# Patient Record
Sex: Male | Born: 1990 | Race: Black or African American | Hispanic: No | Marital: Single | Smoking: Former smoker
Health system: Southern US, Community
[De-identification: ages and names within clinical notes are randomized; demographics above are authoritative.]

## PROBLEM LIST (undated history)

## (undated) DIAGNOSIS — R7612 Nonspecific reaction to cell mediated immunity measurement of gamma interferon antigen response without active tuberculosis: Secondary | ICD-10-CM

## (undated) DIAGNOSIS — R911 Solitary pulmonary nodule: Secondary | ICD-10-CM

## (undated) HISTORY — DX: Nonspecific reaction to cell mediated immunity measurement of gamma interferon antigen response without active tuberculosis: R76.12

## (undated) HISTORY — DX: Solitary pulmonary nodule: R91.1

---
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
## 2011-09-21 ENCOUNTER — Emergency Department (HOSPITAL_COMMUNITY): Payer: Self-pay

## 2011-09-21 ENCOUNTER — Emergency Department (HOSPITAL_COMMUNITY)
Admission: EM | Admit: 2011-09-21 | Discharge: 2011-09-21 | Disposition: A | Discharge status: Home / Self Care | Payer: Self-pay | Attending: Emergency Medicine | Admitting: Emergency Medicine

## 2011-09-21 ENCOUNTER — Encounter (HOSPITAL_COMMUNITY): Payer: Self-pay | Admitting: *Deleted

## 2011-09-21 DIAGNOSIS — W268XXA Contact with other sharp object(s), not elsewhere classified, initial encounter: Secondary | ICD-10-CM | POA: Insufficient documentation

## 2011-09-21 DIAGNOSIS — F17200 Nicotine dependence, unspecified, uncomplicated: Secondary | ICD-10-CM | POA: Insufficient documentation

## 2011-09-21 DIAGNOSIS — S51809A Unspecified open wound of unspecified forearm, initial encounter: Secondary | ICD-10-CM | POA: Insufficient documentation

## 2011-09-21 DIAGNOSIS — S41109A Unspecified open wound of unspecified upper arm, initial encounter: Secondary | ICD-10-CM | POA: Insufficient documentation

## 2011-09-21 DIAGNOSIS — Y92009 Unspecified place in unspecified non-institutional (private) residence as the place of occurrence of the external cause: Secondary | ICD-10-CM | POA: Insufficient documentation

## 2011-09-21 DIAGNOSIS — S51811A Laceration without foreign body of right forearm, initial encounter: Secondary | ICD-10-CM

## 2011-09-21 MED ORDER — DOUBLE ANTIBIOTIC 500-10000 UNIT/GM EX OINT
TOPICAL_OINTMENT | Freq: Once | CUTANEOUS | Status: AC
  Administered 2011-09-21: 17:00:00 via TOPICAL

## 2011-09-21 MED ORDER — BACITRACIN ZINC 500 UNIT/GM EX OINT
TOPICAL_OINTMENT | CUTANEOUS | Status: AC
  Filled 2011-09-21: qty 2.7

## 2011-09-21 MED ORDER — LIDOCAINE HCL (PF) 1 % IJ SOLN
INTRAMUSCULAR | Status: AC
  Filled 2011-09-21: qty 5

## 2011-09-21 MED ORDER — HYDROCODONE-ACETAMINOPHEN 5-325 MG PO TABS: 1.0000 | ORAL_TABLET | ORAL | Status: AC | PRN

## 2011-09-21 MED ORDER — LIDOCAINE HCL (PF) 1 % IJ SOLN
10.0000 mL | Freq: Once | INTRAMUSCULAR | Status: AC
  Administered 2011-09-21: 10 mL
  Filled 2011-09-21: qty 5

## 2011-09-21 MED ORDER — IBUPROFEN 800 MG PO TABS
800.0000 mg | ORAL_TABLET | Freq: Once | ORAL | Status: AC
  Administered 2011-09-21: 800 mg via ORAL
  Filled 2011-09-21: qty 1

## 2011-09-21 NOTE — ED Provider Notes (Signed)
History     CSN: [id number]  Arrival date & time 09/21/11  1304   First MD Initiated Contact with Patient 09/21/11 1345      Chief Complaint  Patient presents with  . Extremity Laceration    (Consider location/radiation/quality/duration/timing/severity/associated sxs/prior treatment) HPI Comments: Kevin Cooley presents with laceration to his right forearm and upper arm after he broke the glass on his storm door attempting to get into his locked house.  He has applied direct pressure to the wounds and the bleeding has improved.  Pain is constant at the sites.  He denies numbness or weakness distal to the injury sites.  He is up to date with his tetanus status.  The history is provided by the patient and a relative.    History reviewed. No pertinent past medical history.  History reviewed. No pertinent past surgical history.  History reviewed. No pertinent family history.  History  Substance Use Topics  . Smoking status: Current Everyday Smoker  . Smokeless tobacco: Not on file  . Alcohol Use: No      Review of Systems  Constitutional: Negative for fever and chills.  HENT: Negative for facial swelling.   Respiratory: Negative for shortness of breath and wheezing.   Skin: Positive for wound.  Neurological: Negative for weakness and numbness.    Allergies  Review of patient's allergies indicates no known allergies.  Home Medications   Current Outpatient Rx  Name Route Sig Dispense Refill  . HYDROCODONE-ACETAMINOPHEN 5-325 MG PO TABS Oral Take 1 tablet by mouth every 4 (four) hours as needed for pain. 15 tablet 0    BP 135/98  Pulse 77  Temp 97.5 F (36.4 C) (Oral)  Resp 20  Ht 5\' 10"  (1.778 m)  Wt 141 lb (63.957 kg)  BMI 20.23 kg/m2  SpO2 100%  Physical Exam  Constitutional: He is oriented to person, place, and time. He appears well-developed and well-nourished.  HENT:  Head: Normocephalic.  Cardiovascular: Normal rate.   Pulses:      Radial  pulses are 2+ on the right side, and 2+ on the left side.  Pulmonary/Chest: Effort normal.  Musculoskeletal: He exhibits tenderness.       Left upper arm: He exhibits laceration.       Arms:      ttp at sites of lacerations.  Neurological: He is alert and oriented to person, place, and time. He has normal strength. No sensory deficit.  Skin: Laceration noted.          Superficial posterior upper arm abrasion.    ED Course  Procedures (including critical care time)  Labs Reviewed - No data to display Dg Forearm Right  09/21/2011  *RADIOLOGY REPORT*  Clinical Data: Laceration.  RIGHT FOREARM - 2 VIEW  Comparison: 09/21/2011 upper arm.  Findings: Soft tissue defect noted within the medial soft tissues of the right forearm.  No radiopaque foreign body.  No underlying bony abnormality.  No fracture, subluxation or dislocation.  IMPRESSION: Soft tissue defect.  No fracture or radiopaque foreign body.   Original Report Authenticated By: Cyndie Chime, M.D.    Dg Humerus Right  09/21/2011  *RADIOLOGY REPORT*  Clinical Data: Extremity laceration.  RIGHT HUMERUS - 2+ VIEW  Comparison: None.  Findings: No acute bony abnormality.  Specifically, no fracture, subluxation, or dislocation.  Soft tissues are intact.  No radiopaque foreign bodies within the soft tissues.  IMPRESSION: No acute bony abnormality.   Original Report Authenticated By: Cyndie Chime,  M.D.      1. Laceration of right forearm       MDM  Multiple lacerations with wound repair.  Xrays reviewed and negative for glass fb.  Wounds were clean. Dressing applied with bacitracin ointment.  Return in 10 days for suture removal.    LACERATION REPAIR Performed by: Burgess Amor Authorized by: Burgess Amor Consent: Verbal consent obtained. Risks and benefits: risks, benefits and alternatives were discussed Consent given by: patient Patient identity confirmed: provided demographic data Prepped and Draped in normal sterile  fashion Wound explored  Laceration Location: right upper arm  Laceration Length: 3cm  No Foreign Bodies seen or palpated  Anesthesia: local infiltration  Local anesthetic: lidocaine 1% without epinephrine  Anesthetic total: 2 ml  Irrigation method: syringe Amount of cleaning: standard  Skin closure: ethilon 4-0  Number of sutures: 7  Technique: simple interrupted  Patient tolerance: Patient tolerated the procedure well with no immediate complications.   LACERATION REPAIR Performed by: Burgess Amor Authorized by: Burgess Amor Consent: Verbal consent obtained.    Risks and benefits: risks, benefits and alternatives were discussed Consent given by: patient Patient identity confirmed: provided demographic data Prepped and Draped in normal sterile fashion Wound explored  Laceration Location: right volar forearm  Laceration Length: 6cm  No Foreign Bodies seen or palpated  Anesthesia: local infiltration  Local anesthetic: lidocaine 1% without epinephrine  Anesthetic total: 4 ml  Irrigation method: syringe Amount of cleaning: standard  Skin closure: ethilon 4-0  Number of sutures: 10  Technique: simple interrupted  Patient tolerance: Patient tolerated the procedure well with no immediate complications.   LACERATION REPAIR Performed by: Burgess Amor Authorized by: Burgess Amor Consent: Verbal consent obtained. Risks and benefits: risks, benefits and alternatives were discussed Consent given by: patient Patient identity confirmed: provided demographic data Prepped and Draped in normal sterile fashion Wound explored  Laceration Location: 1  Laceration Length:  0.75 cm  No Foreign Bodies seen or palpated  Anesthesia: local infiltration  Local anesthetic: lidocaine 1% without epinephrine  Anesthetic total: 1 ml  Irrigation method: syringe Amount of cleaning: standard  Skin closure: ethilon 4-0  Number of sutures: 3  Technique: simple  interrupted.  Patient tolerance: Patient tolerated the procedure well with no immediate complications.      Burgess Amor, [place] 09/21/11 1734

## 2011-09-21 NOTE — ED Notes (Addendum)
Lac to rt forearm, put arm thru door window. Lac also to upper arm,No active bleeding.

## 2011-09-24 NOTE — ED Provider Notes (Signed)
Medical screening examination/treatment/procedure(s) were performed by non-physician practitioner and as supervising physician I was immediately available for consultation/collaboration. Gonsalo Cuthbertson, MD, FACEP   Abbagail Scaff L Jacorey Donaway, MD 09/24/11 0238 

## 2011-09-30 ENCOUNTER — Emergency Department (HOSPITAL_COMMUNITY)
Admission: EM | Admit: 2011-09-30 | Discharge: 2011-09-30 | Disposition: A | Discharge status: Home / Self Care | Payer: Self-pay | Attending: Emergency Medicine | Admitting: Emergency Medicine

## 2011-09-30 ENCOUNTER — Encounter (HOSPITAL_COMMUNITY): Payer: Self-pay

## 2011-09-30 DIAGNOSIS — IMO0002 Reserved for concepts with insufficient information to code with codable children: Secondary | ICD-10-CM

## 2011-09-30 DIAGNOSIS — Z4802 Encounter for removal of sutures: Secondary | ICD-10-CM | POA: Insufficient documentation

## 2011-09-30 DIAGNOSIS — F17200 Nicotine dependence, unspecified, uncomplicated: Secondary | ICD-10-CM | POA: Insufficient documentation

## 2011-09-30 NOTE — ED Notes (Signed)
Here for suture, staple removal.  Dressed.NAD

## 2011-09-30 NOTE — ED Notes (Signed)
Pt reports here for staple removal from r arm.  Says were put in last Monday.

## 2011-10-01 NOTE — ED Provider Notes (Signed)
Medical screening examination/treatment/procedure(s) were performed by non-physician practitioner and as supervising physician I was immediately available for consultation/collaboration.  Winton Offord, MD 10/01/11 2248 

## 2011-10-01 NOTE — ED Provider Notes (Signed)
History     CSN: [id number]  Arrival date & time 09/30/11  1550   First MD Initiated Contact with Patient 09/30/11 1607      Chief Complaint  Patient presents with  . Suture / Staple Removal    (Consider location/radiation/quality/duration/timing/severity/associated sxs/prior treatment) Patient is a 21 y.o. male presenting with suture removal. The history is provided by the patient.  Suture / Staple Removal  The sutures were placed 7 to 10 days ago. There has been no treatment since the wound repair. There has been no drainage from the wound. There is no redness present. The swelling has improved. The pain has improved.    History reviewed. No pertinent past medical history.  History reviewed. No pertinent past surgical history.  No family history on file.  History  Substance Use Topics  . Smoking status: Current Everyday Smoker  . Smokeless tobacco: Not on file  . Alcohol Use: No      Review of Systems  Constitutional: Negative for fever and chills.  HENT: Negative for facial swelling.   Respiratory: Negative for shortness of breath and wheezing.   Skin: Positive for wound.  Neurological: Negative for numbness.    Allergies  Review of patient's allergies indicates no known allergies.  Home Medications   Current Outpatient Rx  Name Route Sig Dispense Refill  . HYDROCODONE-ACETAMINOPHEN 5-325 MG PO TABS Oral Take 1 tablet by mouth every 4 (four) hours as needed for pain. 15 tablet 0    BP 134/82  Pulse 88  Temp 99 F (37.2 C) (Oral)  Resp 20  Ht 5\' 10"  (1.778 m)  Wt 134 lb (60.782 kg)  BMI 19.23 kg/m2  SpO2 100%  Physical Exam  Constitutional: He is oriented to person, place, and time. He appears well-developed and well-nourished.  HENT:  Head: Normocephalic.  Cardiovascular: Normal rate.   Pulmonary/Chest: Effort normal.  Neurological: He is alert and oriented to person, place, and time. No sensory deficit.  Skin: Laceration noted.       3  sutured sites on right volar forearm and upper arm,  All appear healing well with no erythema or evidence of infection.  The larger wound on the distal upper arm has edema at one end of the wound site, none tender, no drainage or erythema.  The laceration at this not completely approximated.      ED Course  Procedures (including critical care time)  Labs Reviewed - No data to display No results found.   1. Dressing change/suture removal     Sutures removed without difficulty.  Largest lac with continued edema continues to have slightly separating wound edges.  Sterile strips were applied for added strength.  Pt encouraged to continue protecting this site with dressing,  Heating pad may help reduce swelling,  Suspect hematoma, as there is no ttp,  No erythema or increases warmth.  MDM  Prn f/u.  Pt advised to allow sterile strips to fall away.  Return here for a recheck if pt has any problems or concerns.        Burgess Amor, [place] 10/01/11 [phone]

## 2011-12-21 ENCOUNTER — Emergency Department (HOSPITAL_COMMUNITY)
Admission: EM | Admit: 2011-12-21 | Discharge: 2011-12-21 | Disposition: A | Discharge status: Home / Self Care | Payer: Self-pay | Attending: Emergency Medicine | Admitting: Emergency Medicine

## 2011-12-21 ENCOUNTER — Encounter (HOSPITAL_COMMUNITY): Payer: Self-pay | Admitting: *Deleted

## 2011-12-21 ENCOUNTER — Emergency Department (HOSPITAL_COMMUNITY): Payer: Self-pay

## 2011-12-21 DIAGNOSIS — R1013 Epigastric pain: Secondary | ICD-10-CM | POA: Insufficient documentation

## 2011-12-21 DIAGNOSIS — F17200 Nicotine dependence, unspecified, uncomplicated: Secondary | ICD-10-CM | POA: Insufficient documentation

## 2011-12-21 DIAGNOSIS — R109 Unspecified abdominal pain: Secondary | ICD-10-CM

## 2011-12-21 MED ORDER — GI COCKTAIL ~~LOC~~
30.0000 mL | Freq: Once | ORAL | Status: AC
  Administered 2011-12-21: 30 mL via ORAL
  Filled 2011-12-21: qty 30

## 2011-12-21 MED ORDER — RANITIDINE HCL 150 MG PO CAPS: 150.0000 mg | ORAL_CAPSULE | Freq: Two times a day (BID) | ORAL | Status: DC

## 2011-12-21 NOTE — ED Provider Notes (Signed)
History    This chart was scribed for [name], MD, MD by Smitty Pluck, ED Scribe. The patient was seen in room [facility]11 and the patient's care was started at 5:21PM.   CSN: [id number]  Arrival date & time 12/21/11  1646      Chief Complaint  Patient presents with  . Abdominal Pain    (Consider location/radiation/quality/duration/timing/severity/associated sxs/prior treatment) Patient is a 21 y.o. male presenting with abdominal pain. The history is provided by the patient. No language interpreter was used.  Abdominal Pain The primary symptoms of the illness include abdominal pain. The primary symptoms of the illness do not include fatigue or diarrhea. The current episode started 2 days ago. The onset of the illness was sudden. The problem has not changed since onset. The abdominal pain is located in the epigastric region. The abdominal pain does not radiate. The abdominal pain is relieved by nothing.  The patient has had a change in bowel habit. Symptoms associated with the illness do not include hematuria, frequency or back pain.   Kevin Cooley is a 21 y.o. male who presents to the Emergency Department complaining of constant, moderate abdominal pain onset 2 days ago. Pt reports that he is constipated and had last bowel movement 2 days ago. He denies nausea, vomiting and diarrhea.   History reviewed. No pertinent past medical history.  History reviewed. No pertinent past surgical history.  History reviewed. No pertinent family history.  History  Substance Use Topics  . Smoking status: Current Every Day Smoker    Types: Cigarettes  . Smokeless tobacco: Not on file  . Alcohol Use: No      Review of Systems  Constitutional: Negative for fatigue.  HENT: Negative for congestion, sinus pressure and ear discharge.   Eyes: Negative for discharge.  Respiratory: Negative for cough.   Cardiovascular: Negative for chest pain.  Gastrointestinal: Positive for abdominal  pain. Negative for diarrhea.  Genitourinary: Negative for frequency and hematuria.  Musculoskeletal: Negative for back pain.  Skin: Negative for rash.  Neurological: Negative for seizures and headaches.  Hematological: Negative.   Psychiatric/Behavioral: Negative for hallucinations.  All other systems reviewed and are negative.    Allergies  Review of patient's allergies indicates no known allergies.  Home Medications  No current outpatient prescriptions on file.  BP 136/79  Pulse 76  Temp 97.5 F (36.4 C) (Oral)  Resp 20  Ht 5\' 10"  (1.778 m)  Wt 30 lb (13.608 kg)  BMI 4.30 kg/m2  SpO2 99%  Physical Exam  Nursing note and vitals reviewed. Constitutional: He is oriented to person, place, and time. He appears well-developed.  HENT:  Head: Normocephalic and atraumatic.  Eyes: Conjunctivae normal and EOM are normal. No scleral icterus.  Neck: Neck supple. No thyromegaly present.  Cardiovascular: Normal rate and regular rhythm.  Exam reveals no gallop and no friction rub.   No murmur heard. Pulmonary/Chest: No stridor. He has no wheezes. He has no rales. He exhibits no tenderness.  Abdominal: He exhibits no distension. There is tenderness in the epigastric area. There is no rebound.  Musculoskeletal: Normal range of motion. He exhibits no edema.  Lymphadenopathy:    He has no cervical adenopathy.  Neurological: He is oriented to person, place, and time. Coordination normal.  Skin: No rash noted. No erythema.  Psychiatric: He has a normal mood and affect. His behavior is normal.    ED Course  Procedures (including critical care time) DIAGNOSTIC STUDIES: Oxygen Saturation is 99%  on room air, normal by my interpretation.    COORDINATION OF CARE: 5:24 PM Discussed ED treatment with pt  5:30 PM Ordered:    . [COMPLETED] gi cocktail  30 mL Oral Once   6:55PM Recheck. Pt is feeling better after medication. Pt is ready for discharge.     Labs Reviewed - No data to  display No results found.   No diagnosis found.    MDM     The chart was scribed for me under my direct supervision.  I personally performed the history, physical, and medical decision making and all procedures in the evaluation of this patient.[name], MD 12/21/11 1900

## 2011-12-21 NOTE — ED Notes (Signed)
Pt with epigastric abd pain x 2 days, denies any N/V/D

## 2011-12-21 NOTE — ED Notes (Signed)
Pt alert & oriented x4, stable gait. Patient given discharge instructions, paperwork & prescription(s). Patient  instructed to stop at the registration desk to finish any additional paperwork. Patient verbalized understanding. Pt left department w/ no further questions. 

## 2014-08-15 IMAGING — CR DG ABDOMEN ACUTE W/ 1V CHEST
4 series · 4 of 4 positions shown · non-contrast
Comparison: 04/09/2008

CLINICAL DATA: Epigastric pain

ACUTE ABDOMEN SERIES (ABDOMEN 2 VIEW & CHEST 1 VIEW)

[view not recorded (1 of 4)]
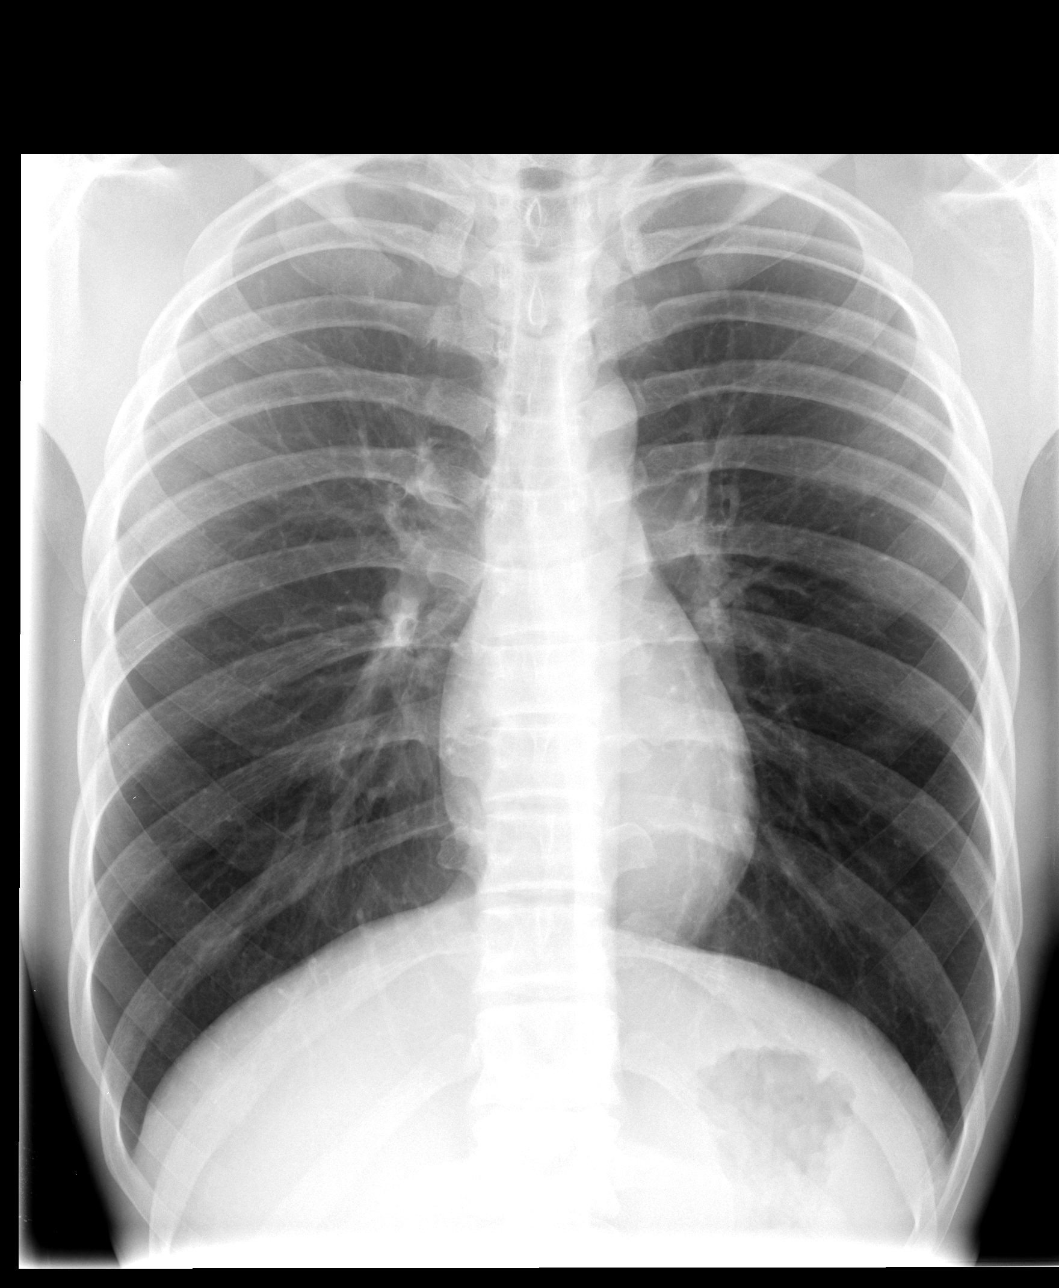

[view not recorded (2 of 4)]
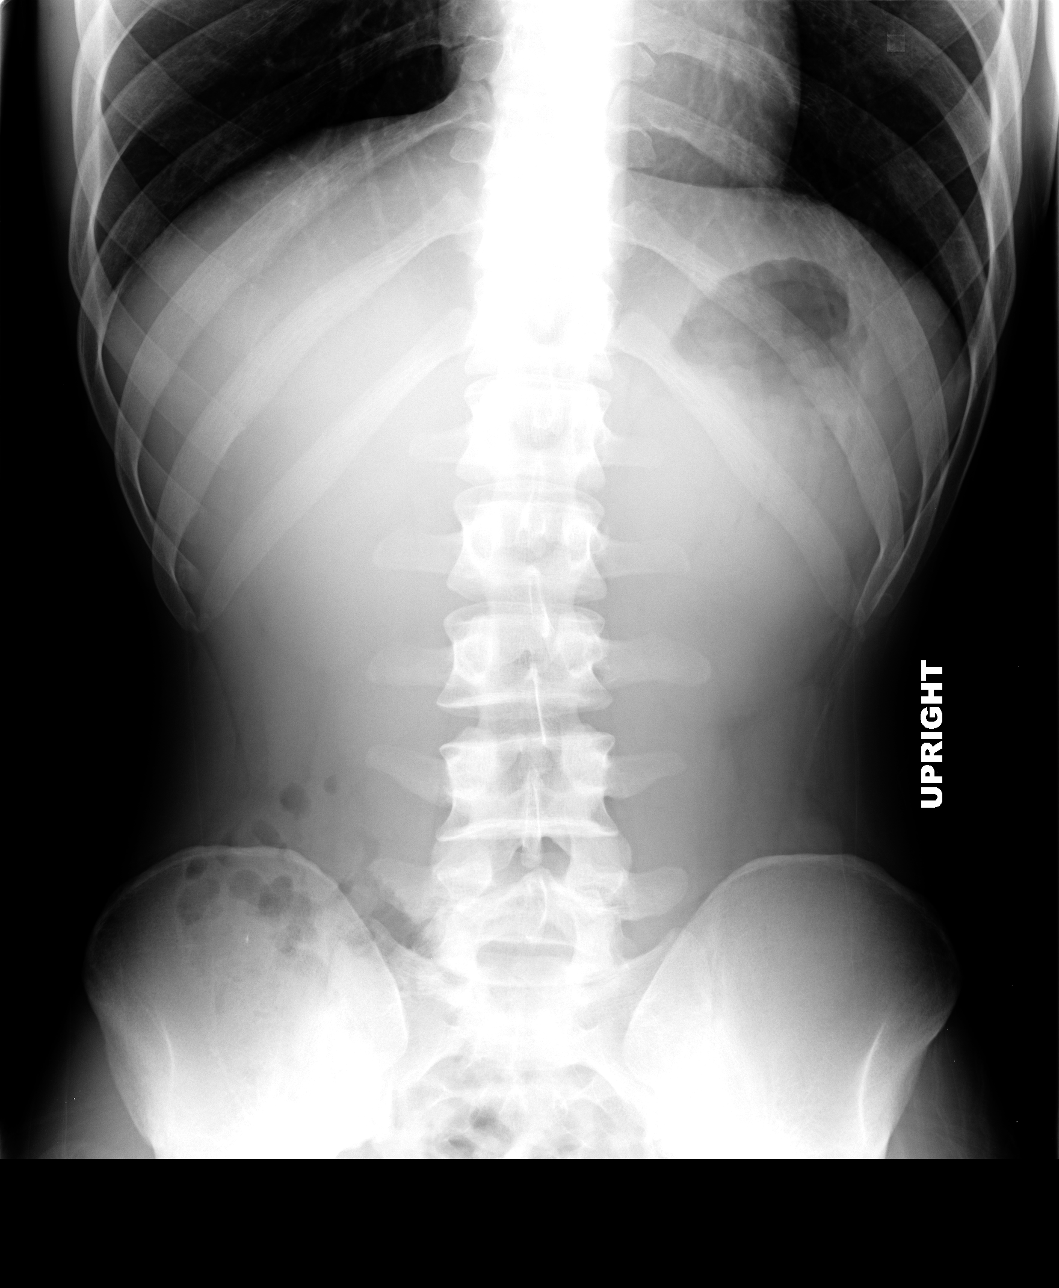

[view not recorded (3 of 4)]
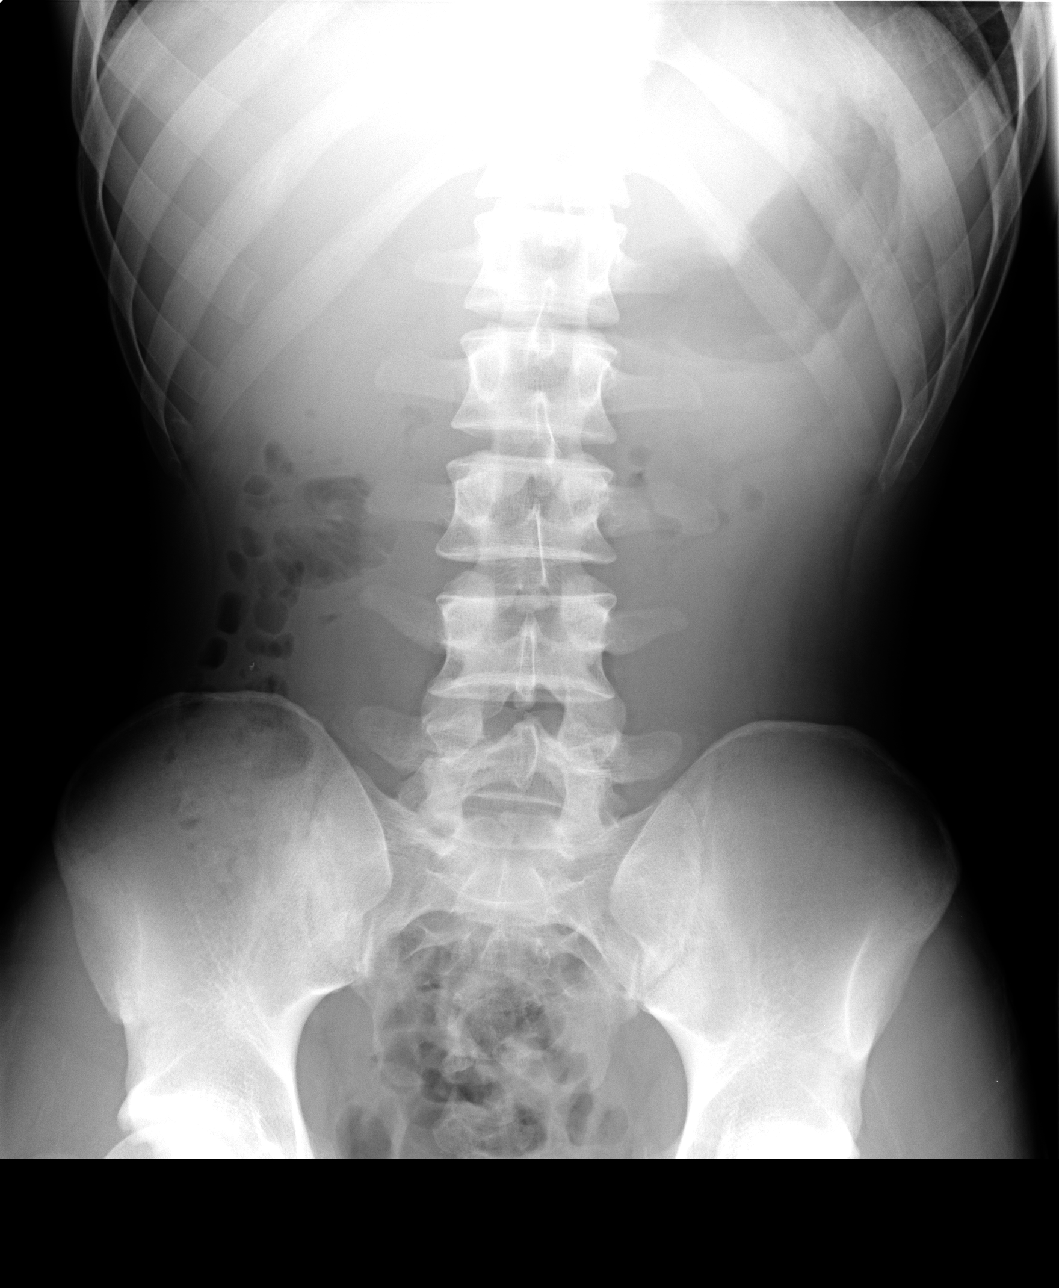

[view not recorded (4 of 4)]
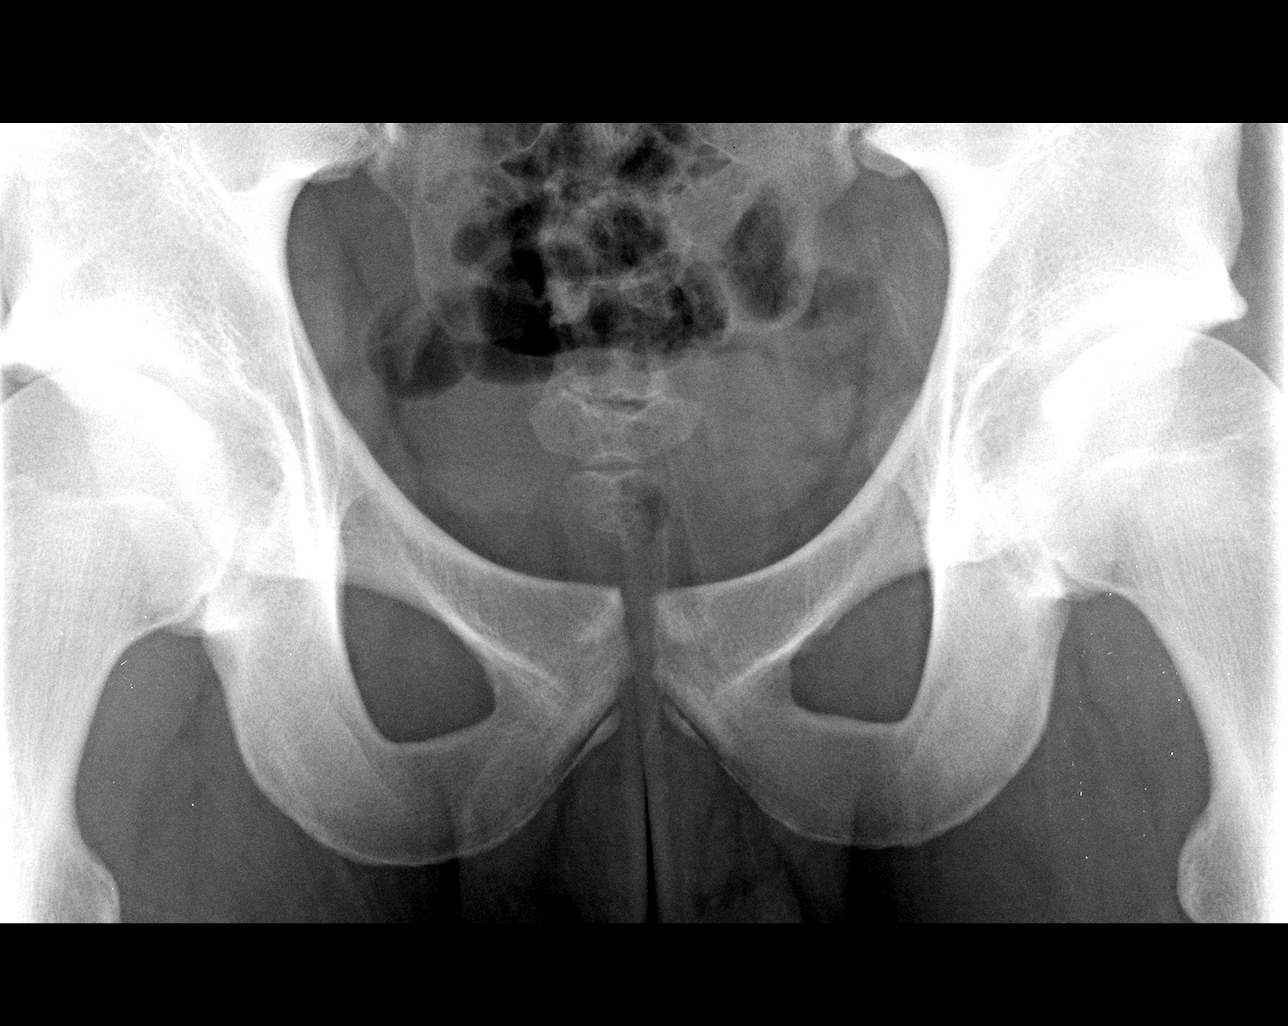

[4 of 4 positions shown; findings below may reference images not displayed]

FINDINGS: Lungs clear.  Heart size normal.  No effusion.  No free
air.  Paucity of small bowel gas.  The colon is decompressed.  No
abnormal abdominal calcifications.
IMPRESSION: 1.  Normal bowel gas pattern.  No free air.

## 2016-06-08 ENCOUNTER — Ambulatory Visit: Payer: Self-pay | Admitting: Obstetrics & Gynecology

## 2021-12-01 ENCOUNTER — Ambulatory Visit (INDEPENDENT_AMBULATORY_CARE_PROVIDER_SITE_OTHER): Payer: Self-pay

## 2021-12-01 ENCOUNTER — Other Ambulatory Visit: Payer: Self-pay

## 2021-12-01 ENCOUNTER — Ambulatory Visit
Admission: RE | Admit: 2021-12-01 | Discharge: 2021-12-01 | Disposition: A | Discharge status: Home / Self Care | Payer: Self-pay | Source: Ambulatory Visit | Attending: Emergency Medicine | Admitting: Emergency Medicine

## 2021-12-01 ENCOUNTER — Emergency Department (HOSPITAL_COMMUNITY)
Admission: EM | Admit: 2021-12-01 | Discharge: 2021-12-02 | Disposition: A | Discharge status: Home / Self Care | Payer: BC Managed Care – PPO | Attending: Student | Admitting: Student

## 2021-12-01 ENCOUNTER — Encounter (HOSPITAL_COMMUNITY): Payer: Self-pay

## 2021-12-01 ENCOUNTER — Emergency Department (HOSPITAL_COMMUNITY): Payer: BC Managed Care – PPO

## 2021-12-01 VITALS — BP 141/102 | HR 81 | Temp 98.9°F | Resp 18

## 2021-12-01 DIAGNOSIS — R059 Cough, unspecified: Secondary | ICD-10-CM | POA: Diagnosis not present

## 2021-12-01 DIAGNOSIS — F17200 Nicotine dependence, unspecified, uncomplicated: Secondary | ICD-10-CM | POA: Insufficient documentation

## 2021-12-01 DIAGNOSIS — J984 Other disorders of lung: Secondary | ICD-10-CM

## 2021-12-01 DIAGNOSIS — J439 Emphysema, unspecified: Secondary | ICD-10-CM | POA: Diagnosis not present

## 2021-12-01 LAB — BASIC METABOLIC PANEL
Anion gap: 7 (ref 5–15)
BUN: 6 mg/dL (ref 6–20)
CO2: 26 mmol/L (ref 22–32)
Calcium: 9 mg/dL (ref 8.9–10.3)
Chloride: 104 mmol/L (ref 98–111)
Creatinine, Ser: 0.9 mg/dL (ref 0.61–1.24)
GFR, Estimated: >60 mL/min (ref >60)
Glucose, Bld: 109 mg/dL — ABNORMAL HIGH (ref 70–99)
Potassium: 3.6 mmol/L (ref 3.5–5.1)
Sodium: 137 mmol/L (ref 135–145)

## 2021-12-01 LAB — CBC WITH DIFFERENTIAL/PLATELET
Abs Immature Granulocytes: 0.03 10*3/uL (ref 0.00–0.07)
Basophils Absolute: 0 10*3/uL (ref 0.0–0.1)
Basophils Relative: 0 %
Eosinophils Absolute: 0 10*3/uL (ref 0.0–0.5)
Eosinophils Relative: 0 %
HCT: 38.6 % — ABNORMAL LOW (ref 39.0–52.0)
Hemoglobin: 13.2 g/dL (ref 13.0–17.0)
Immature Granulocytes: 0 %
Lymphocytes Relative: 24 %
Lymphs Abs: 2 10*3/uL (ref 0.7–4.0)
MCH: 33 pg (ref 26.0–34.0)
MCHC: 34.2 g/dL (ref 30.0–36.0)
MCV: 96.5 fL (ref 80.0–100.0)
Monocytes Absolute: 0.6 10*3/uL (ref 0.1–1.0)
Monocytes Relative: 7 %
Neutro Abs: 5.7 10*3/uL (ref 1.7–7.7)
Neutrophils Relative %: 69 %
Platelets: 238 10*3/uL (ref 150–400)
RBC: 4 MIL/uL — ABNORMAL LOW (ref 4.22–5.81)
RDW: 11.4 % — ABNORMAL LOW (ref 11.5–15.5)
WBC: 8.4 10*3/uL (ref 4.0–10.5)
nRBC: 0 % (ref 0.0–0.2)

## 2021-12-01 MED ORDER — IOHEXOL 350 MG/ML SOLN
50.0000 mL | Freq: Once | INTRAVENOUS | Status: AC | PRN
  Administered 2021-12-01: 50 mL via INTRAVENOUS

## 2021-12-01 MED ORDER — ACETAMINOPHEN 500 MG PO TABS: 1000.0000 mg | ORAL_TABLET | Freq: Once | ORAL | Status: DC

## 2021-12-01 NOTE — Discharge Instructions (Signed)
Please go to the emergency department at [facility] to have a CT scan of your chest to evaluate the cavitary lesion in your right lung base.  Please go now.

## 2021-12-01 NOTE — ED Triage Notes (Signed)
Pt reports cough starting one month ago. R rib pain x 1 week, worse with any pressure (cough, deep breathing, etc).  Slightly tender to palpation.  Cough is improving at this point. Has taken Tylenol for pain with no relief, took his mother's percocet which helped him sleep but pain returned.

## 2021-12-01 NOTE — ED Provider Notes (Signed)
[facility]    CSN: [id number] Arrival date & time: 12/01/21  1547      History   Chief Complaint Chief Complaint  Patient presents with   Chest Injury    Having issues with my whole right side - Entered by patient    HPI [place] J Andress is a 31 y.o. male.   HPI  73 old male here for evaluation of cough and rib pain.  The patient reports that he has been coughing for months and has been experiencing rib pain for the last week.  The rib pain is mostly on the right and increases with deep breathing and also movement.  His cough is nonproductive.  Patient is a smoker.  He denies any fever, shortness breath, or wheezing.  History reviewed. No pertinent past medical history.  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 No pertinent surgical history.     Home Medications    Prior to Admission medications   Not on File    Family History History reviewed. No pertinent family history.  Social History Social History   Tobacco Use   Smoking status: Every Day    Types: Cigarettes  Vaping Use   Vaping Use: Never used  Substance Use Topics   Alcohol use: No   Drug use: No     Allergies   Patient has no known allergies.   Review of Systems Review of Systems  Constitutional:  Negative for fever.  Respiratory:  Positive for cough. Negative for shortness of breath and wheezing.   Cardiovascular:  Positive for chest pain.       Right rib pain.     Physical Exam Triage Vital Signs ED Triage Vitals [12/01/21 1604]  Enc Vitals Group     BP      Pulse      Resp      Temp      Temp src      SpO2      Weight      Height      Head Circumference      Peak Flow      Pain Score 7     Pain Loc      Pain Edu?      Excl. in GC?    No data found.  Updated Vital Signs BP (!) 141/102 (BP Location: Left Arm)   Pulse 81   Temp 98.9 F (37.2 C) (Oral)    Resp 18   SpO2 100%   Visual Acuity Right Eye Distance:   Left Eye Distance:   Bilateral Distance:    Right Eye Near:   Left Eye Near:    Bilateral Near:     Physical Exam Vitals and nursing note reviewed.  Constitutional:      Appearance: Normal appearance. He is not ill-appearing.  HENT:     Head: Normocephalic and atraumatic.  Cardiovascular:     Rate and Rhythm: Normal rate and regular rhythm.     Pulses: Normal pulses.     Heart sounds: Normal heart sounds. No murmur heard.    No friction rub. No gallop.  Pulmonary:     Effort: Pulmonary effort is normal.     Breath sounds: Normal breath sounds. No wheezing, rhonchi or rales.  Chest:     Chest wall: No tenderness.  Skin:    General: Skin is warm and dry.  Capillary Refill: Capillary refill takes less than 2 seconds.     Findings: No bruising or erythema.  Neurological:     General: No focal deficit present.     Mental Status: He is alert and oriented to person, place, and time.  Psychiatric:        Mood and Affect: Mood normal.        Behavior: Behavior normal.        Thought Content: Thought content normal.        Judgment: Judgment normal.      UC Treatments / Results  Labs (all labs ordered are listed, but only abnormal results are displayed) Labs Reviewed - No data to display  EKG   Radiology DG Ribs Unilateral W/Chest Right  Result Date: 12/01/2021 CLINICAL DATA:  Cough rib pain for 1 month. EXAM: RIGHT RIBS AND CHEST - 3+ VIEW COMPARISON:  Abdominal series 12/21/2011. FINDINGS: No fracture or other bone lesions are seen involving the ribs. Subtle thin walled cystic structure seen in the right lung base with questionable air-fluid levels. This measures approximally 6.6 by 5.3 cm. The lungs are otherwise clear. No pleural effusion or pneumothorax. Cardiomediastinal silhouette is within normal limits. IMPRESSION: 1. Subtle thin walled cystic structure seen in the right lung base with questionable  air-fluid levels. This could represent a cavitary lesion or cavitary pneumonia. Recommend further evaluation with CT of the chest with contrast. 2. No rib fracture identified. Electronically Signed   By: Ronney Asters M.D.   On: 12/01/2021 16:44    Procedures Procedures (including critical care time)  Medications Ordered in UC Medications - No data to display  Initial Impression / Assessment and Plan / UC Course  I have reviewed the triage vital signs and the nursing notes.  Pertinent labs & imaging results that were available during my care of the patient were reviewed by me and considered in my medical decision making (see chart for details).   Patient is a pleasant, nontoxic-appearing 38 old male here for evaluation of right rib pain and cough this been going on for the past month as outlined HPI above.  On exam patient is not in any acute distress and is able to speak in full sentence without dyspnea or tachypnea.  There is no ecchymosis or erythema to the chest wall and I cannot reproduce the pain to palpation of the ribs of the posterior or lateral chest wall.  His lungs are clear to auscultation all fields.  I will obtain a right rib series to look for the presence of a broken rib but I suspect that patient has strained his rib cage secondary to coughing.  Radiology impression of chest x-ray states there is a subtle thin-walled cystic structure seen in the right lung base with questionable air-fluid levels.  This could represent a cavitary lesion or cavitary pneumonia.  Recommend further evaluation with CT scan of chest with contrast.  No rib fracture identified.  Patient denies any weight loss or night sweats.  However, given the cavitary lesion on his chest x-ray I have advised patient that he needs a CT scan of his chest and the family to get that tonight is to go to the ER.  He has elected to go to [facility] ER.   Final Clinical Impressions(s) / UC Diagnoses   Final diagnoses:  Cavitary  lesion of lung     Discharge Instructions      Please go to the emergency department at [facility] to have a CT  scan of your chest to evaluate the cavitary lesion in your right lung base.  Please go now.     ED Prescriptions   None    I have reviewed the PDMP during this encounter.   Becky Augusta, NP 12/01/21 [phone]

## 2021-12-01 NOTE — ED Provider Triage Note (Signed)
Emergency Medicine Provider Triage Evaluation Note  Kevin Cooley , a 31 y.o. male  was evaluated in triage.  Pt complains of cough for 1 month.  Was seen at urgent care and they were concerned about a cavitary pneumonia and sent him here.  Denies any fevers, night sweats, weight loss, recent travel or new medications.  Does endorse smoking cigarettes  Review of Systems  Positive: Cough for a month and right rib pain Negative: As above  Physical Exam  BP (!) 148/93 (BP Location: Right Arm)   Pulse 91   Temp 98.2 F (36.8 C) (Oral)   Resp 16   Ht 5\' 10"  (1.778 m)   Wt 65.8 kg   SpO2 99%   BMI 20.81 kg/m  Gen:   Awake, no distress   Resp:  Normal effort  MSK:   Moves extremities without difficulty  Other:  Lung sounds clear, normal work of breathing.  RRR  Medical Decision Making  Medically screening exam initiated at 7:15 PM.  Appropriate orders placed.  Kevin Cooley was informed that the remainder of the evaluation will be completed by another provider, this initial triage assessment does not replace that evaluation, and the importance of remaining in the ED until their evaluation is complete.     Rhae Hammock, [place] 12/01/21 1916

## 2021-12-01 NOTE — ED Notes (Signed)
Patient is being discharged from the Urgent Care and sent to the Emergency Department via POV . Per Thurmond Butts, NP patient is in need of higher level of care due to mass located on lung. Patient is aware and verbalizes understanding of plan of care.  Vitals:   12/01/21 1608  BP: (!) 141/102  Pulse: 81  Resp: 18  Temp: 98.9 F (37.2 C)  SpO2: 100%

## 2021-12-01 NOTE — ED Triage Notes (Signed)
Patient reports having cough x 1 month and then about a week ago started having R rib pain.  Reports productive cough with green sputum.  Denies fever night sweats.  Denies any exposure to high risk populations for TB such as jail, drug use or travel.

## 2021-12-02 MED ORDER — ALBUTEROL SULFATE HFA 108 (90 BASE) MCG/ACT IN AERS
2.0000 | INHALATION_SPRAY | RESPIRATORY_TRACT | Status: DC | PRN
  Administered 2021-12-02: 2 via RESPIRATORY_TRACT
  Filled 2021-12-02: qty 6.7

## 2021-12-02 MED ORDER — PREDNISONE 20 MG PO TABS: 40.0000 mg | ORAL_TABLET | Freq: Every day | ORAL | 0 refills | Status: DC

## 2021-12-02 NOTE — Discharge Instructions (Addendum)
You need to follow-up with the pulmonologist.  Terisa Starr our case manager to help you get an appointment.  Please call the number listed as well.   Please stop smoking.

## 2021-12-02 NOTE — ED Provider Notes (Signed)
[facility]-EMER[facility]ENC[facility] DEPT [facility] Emergency Department Provider Note MRN:  [id number]  Arrival date & time: 12/02/21     Chief Complaint   Cough   History of Present Illness   Kevin Cooley is a 31 y.o. year-old male presents to the ED with chief complaint of chief complaint of chest pain and rib pain.  Reports having had a cough for several weeks.  Had an abnormal chest x-ray finding and was sent to the emergency department for further evaluation.  He denies fevers or chills.  States his cough is nonproductive.  He reports smoking for the past 10 years.    History provided by patient.   Review of Systems  Pertinent positive and negative review of systems noted in HPI.    Physical Exam   Vitals:   12/02/21 0046 12/02/21 0156  BP: (!) 146/103 (!) 136/91  Pulse: 70 70  Resp: 16 16  Temp: 98.4 F (36.9 C)   SpO2: 99% 98%    CONSTITUTIONAL:  non toxic-appearing, NAD NEURO:  Alert and oriented x 3, CN 3-12 grossly intact EYES:  eyes equal and reactive ENT/NECK:  Supple, no stridor  CARDIO:  normal rate, regular rhythm, appears well-perfused  PULM:  No respiratory distress,  GI/GU:  non-distended,  MSK/SPINE:  No gross deformities, no edema, moves all extremities  SKIN:  no rash, atraumatic   *Additional and/or pertinent findings included in MDM below  Diagnostic and Interventional Summary    Labs Reviewed  CBC WITH DIFFERENTIAL/PLATELET - Abnormal; Notable for the following components:      Result Value   RBC 4.00 (*)    HCT 38.6 (*)    RDW 11.4 (*)    All other components within normal limits  BASIC METABOLIC PANEL - Abnormal; Notable for the following components:   Glucose, Bld 109 (*)    All other components within normal limits  ALPHA-1-ANTITRYPSIN    CT Chest W Contrast  Final Result      Medications  albuterol (VENTOLIN HFA) 108 (90 Base) MCG/ACT inhaler 2 puff (2 puffs Inhalation Provided for home use 12/02/21 0157)  iohexol (OMNIPAQUE) 350  MG/ML injection 50 mL (50 mLs Intravenous Contrast Given 12/01/21 2247)     Procedures  /  Critical Care Procedures  ED Course and Medical Decision Making  I have reviewed the triage vital signs, the nursing notes, and pertinent available records from the EMR.  Social Determinants Affecting Complexity of Care: Patient has no clinically significant social determinants affecting this chief complaint..   ED Course:    Medical Decision Making Patient here with cough for the past several weeks.  Sent from urgent care because of abnormal chest x-ray.  Afebrile.  Not wheezing.  He does have significant smoking history as well as family history of pancreatic cancer.  Chest CT ordered in triage shows pulmonary bullae and emphysematous changes.  Given age, consider alpha 1 antitrypsin deficiency.  We will add this blood test.  Patient will need to follow-up with pulmonology.  We will trial prednisone and inhaler.  Discussed risk of spontaneous pneumothorax and return precautions.  Amount and/or Complexity of Data Reviewed Labs: ordered.  Risk Prescription drug management.     Consultants: No consultations were needed in caring for this patient.   Treatment and Plan: I considered admission due to patient's initial presentation, but after considering the examination and diagnostic results, patient will not require admission and can be discharged with outpatient follow-up.  Patient seen by and discussed with  attending physician, Dr. Matilde Sprang, who agrees with plan for pulm follow-up.  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  Rene Paci messaged our case manager to  help you get an appointment.  Please call the number listed as well.   Please stop smoking.       Montine Circle, PA-C 12/02/21 0202    Teressa Lower, MD 12/03/21 [phone]

## 2021-12-03 LAB — ALPHA-1-ANTITRYPSIN: A-1 Antitrypsin, Ser: 185 mg/dL — ABNORMAL HIGH (ref 95–164)

## 2021-12-18 ENCOUNTER — Telehealth: Payer: Self-pay

## 2021-12-18 ENCOUNTER — Ambulatory Visit (INDEPENDENT_AMBULATORY_CARE_PROVIDER_SITE_OTHER): Payer: Self-pay | Admitting: Emergency Medicine

## 2021-12-18 ENCOUNTER — Encounter: Payer: Self-pay | Admitting: Emergency Medicine

## 2021-12-18 VITALS — BP 128/74 | HR 75 | Temp 98.3°F | Ht 70.0 in | Wt 147.2 lb

## 2021-12-18 DIAGNOSIS — R911 Solitary pulmonary nodule: Secondary | ICD-10-CM

## 2021-12-18 DIAGNOSIS — R9389 Abnormal findings on diagnostic imaging of other specified body structures: Secondary | ICD-10-CM

## 2021-12-18 MED ORDER — DOXYCYCLINE HYCLATE 100 MG PO TABS: 100.0000 mg | ORAL_TABLET | Freq: Two times a day (BID) | ORAL | 0 refills | Status: DC

## 2021-12-18 MED ORDER — HYDROCODONE-ACETAMINOPHEN 5-325 MG PO TABS: 1.0000 | ORAL_TABLET | Freq: Four times a day (QID) | ORAL | 0 refills | Status: DC | PRN

## 2021-12-18 NOTE — Telephone Encounter (Signed)
Pt called requesting meds be sent to [facility] on [address] Vicodin and Doxycycline were d/c's in EPIC and RN called [facility] in [place] to ensure meds were canceled. Christian R. Verified med orders were canceled. Med orders sent to [facility] on [address]

## 2021-12-18 NOTE — Progress Notes (Signed)
Subjective:    Patient ID: Kevin Cooley, male    DOB: [birth year], 31 y.o.   MRN: [id number]  HPI 31 year old former smoker (5 pack years) with little past medical history.  He has been evaluated in urgent care/ER for cough and associated chest and rib pain.  He was treated with prednisone, underwent a CT scan of the chest as below that showed bullous disease.  Alpha-1 antitrypsin level was obtained that was in the normal range (185).  HIV from 09/18/2021 [facility]) was nonreactive.  CT chest 12/01/2021 reviewed by me shows no mediastinal or hilar adenopathy, moderate paraseptal emphysematous change with some focal bullous disease especially in the anterior medial right middle lobe.  Adjacent to the superior margin of the bulla there is some parenchymal opacity, approximately 1 cm of unclear significance   Review of Systems As per HPI  No past medical history on file.   No family history on file.   Social History   Socioeconomic History   Marital status: Single    Spouse name: Not on file   Number of children: Not on file   Years of education: Not on file   Highest education level: Not on file  Occupational History   Not on file  Tobacco Use   Smoking status: Former    Packs/day: 0.50    Years: 14.00    Total pack years: 7.00    Types: Cigarettes   Smokeless tobacco: Not on file   Tobacco comments:    Stop smoking 2 weeks ago   Vaping Use   Vaping Use: Never used  Substance and Sexual Activity   Alcohol use: No   Drug use: No   Sexual activity: Not on file  Other Topics Concern   Not on file  Social History Narrative   Not on file   Social Determinants of Health   Financial Resource Strain: Not on file  Food Insecurity: Not on file  Transportation Needs: Not on file  Physical Activity: Not on file  Stress: Not on file  Social Connections: Not on file  Intimate Partner Violence: Not on file    Works in [employer], commissioning Has worked in [employer] Has  lived in [place], [place], [place] Used vapes, weed but stopped 10/30.   No Known Allergies   Outpatient Medications Prior to Visit  Medication Sig Dispense Refill   predniSONE (DELTASONE) 20 MG tablet Take 2 tablets (40 mg total) by mouth daily. (Patient not taking: Reported on 12/18/2021) 10 tablet 0   No facility-administered medications prior to visit.         Objective:   Physical Exam Vitals:   12/18/21 1413 12/18/21 1416  BP: 128/74   Pulse:  75  Temp: 98.3 F (36.8 C)   TempSrc: Oral   SpO2:  99%  Weight:  147 lb 3.2 oz (66.8 kg)  Height:  5\' 10"  (1.778 m)   Gen: Pleasant, well-nourished, in no distress,  normal affect  ENT: No lesions,  mouth clear,  oropharynx clear, no postnasal drip  Neck: No JVD, no stridor  Lungs: No use of accessory muscles, no crackles or wheezing on normal respiration, no wheeze on forced expiration  Cardiovascular: RRR, heart sounds normal, no murmur or gallops, no peripheral edema  Musculoskeletal: No deformities, no cyanosis or clubbing  Neuro: alert, awake, non focal  Skin: Warm, no lesions or rash      Assessment & Plan:  Abnormal CT of the chest Central focal bullous disease noted especially  in the anterior right middle lobe, right base.  There is some associated more focal infiltrate inferiorly, question whether there may have been pneumonia present that correlated with the patient's symptoms that included significant cough, associated chest pain.  He is completing a course of antibiotics.  Because of his bullous disease is unclear, an alpha-1 antitrypsin level was normal, recent HIV from 09/2021 was nonreactive.  No known family history of premature emphysema.  He has used vapes which could be associated, former tobacco use and THC use.  I did warn him against using vapes or any inhalational THC given some risk for spontaneous pneumothorax.  We will plan to check an alpha-1 antitrypsin phenotype/genotype, QuantiFERON gold for completeness  although suspicion for TB here is quite low.  We will plan to repeat his CT chest in mid December to look for clearance of the right lower lobe infiltrate.  We will also try Vicodin for his associated chest pain short-term.  He did not get much response from prednisone, does get partial response from nonsteroidals.  We will perform lab work today We will repeat your CT scan of the chest in mid December 2023 to compare with your prior. Please take doxycycline 100 mg twice a day for 7 days until completely gone You can use Vicodin 1 up to every 6 hours if needed for pain control.  Please note that this medication can make you drowsy. Follow with Dr Lamonte Sakai in 1 month or next available to review your scan    Baltazar Apo, MD, PhD 12/23/2021, 11:11 AM [facility] [phone] or if no answer before 7:00PM call [phone] For any issues after 7:00PM please call eLink [phone]

## 2021-12-18 NOTE — Patient Instructions (Signed)
We will perform lab work today We will repeat your CT scan of the chest in mid December 2023 to compare with your prior. Please take doxycycline 100 mg twice a day for 7 days until completely gone You can use Vicodin 1 up to every 6 hours if needed for pain control.  Please note that this medication can make you drowsy. Follow with Dr Delton Coombes in 1 month or next available to review your scan

## 2021-12-19 LAB — ANGIOTENSIN CONVERTING ENZYME: Angiotensin-Converting Enzyme: 35 U/L (ref 9–67)

## 2021-12-23 DIAGNOSIS — R9389 Abnormal findings on diagnostic imaging of other specified body structures: Secondary | ICD-10-CM | POA: Insufficient documentation

## 2021-12-23 NOTE — Assessment & Plan Note (Signed)
Central focal bullous disease noted especially in the anterior right middle lobe, right base.  There is some associated more focal infiltrate inferiorly, question whether there may have been pneumonia present that correlated with the patient's symptoms that included significant cough, associated chest pain.  He is completing a course of antibiotics.  Because of his bullous disease is unclear, an alpha-1 antitrypsin level was normal, recent HIV from 09/2021 was nonreactive.  No known family history of premature emphysema.  He has used vapes which could be associated, former tobacco use and THC use.  I did warn him against using vapes or any inhalational THC given some risk for spontaneous pneumothorax.  We will plan to check an alpha-1 antitrypsin phenotype/genotype, QuantiFERON gold for completeness although suspicion for TB here is quite low.  We will plan to repeat his CT chest in mid December to look for clearance of the right lower lobe infiltrate.  We will also try Vicodin for his associated chest pain short-term.  He did not get much response from prednisone, does get partial response from nonsteroidals.  We will perform lab work today We will repeat your CT scan of the chest in mid December 2023 to compare with your prior. Please take doxycycline 100 mg twice a day for 7 days until completely gone You can use Vicodin 1 up to every 6 hours if needed for pain control.  Please note that this medication can make you drowsy. Follow with Dr Delton Coombes in 1 month or next available to review your scan

## 2021-12-24 LAB — QUANTIFERON-TB GOLD PLUS
Mitogen-NIL: 8.24 IU/mL
NIL: 0.25 IU/mL
QuantiFERON-TB Gold Plus: POSITIVE — AB
TB1-NIL: 0.82 IU/mL
TB2-NIL: 1.7 IU/mL

## 2021-12-26 ENCOUNTER — Encounter: Payer: Self-pay | Admitting: Emergency Medicine

## 2021-12-29 NOTE — Telephone Encounter (Signed)
Mychart message sent by pt: Kevin Cooley  P [facility] Pool (supporting Leslye Peer, MD)3 days ago   JM Hello Dr Solon Augusta, I looked at the test results and notice the test came back Positive Abnormal. Do I need to quarantine and have my family tested? If I do need to quarantine, I'll need a work note and how long?    Dr. Delton Coombes, please advise.

## 2021-12-29 NOTE — Telephone Encounter (Signed)
Mychart message sent by RB to pt via mychart message.

## 2021-12-29 NOTE — Telephone Encounter (Signed)
Patient called back for results. Please advise.   

## 2021-12-30 LAB — ALPHA-1 ANTITRYPSIN PHENOTYPE: A-1 Antitrypsin, Ser: 169 mg/dL (ref 83–199)

## 2022-01-13 ENCOUNTER — Ambulatory Visit (HOSPITAL_COMMUNITY): Payer: BC Managed Care – PPO

## 2022-01-20 ENCOUNTER — Ambulatory Visit (HOSPITAL_COMMUNITY): Payer: BC Managed Care – PPO

## 2022-03-11 ENCOUNTER — Telehealth: Payer: Self-pay

## 2022-03-11 NOTE — Telephone Encounter (Signed)
Patient was told to give Dr Damita Dunnings a call to discuss a few things,before establishing as a patient with Dr Damita Dunnings.

## 2022-03-11 NOTE — Telephone Encounter (Signed)
I thought he was going to call about getting established.  If there were an issue about me not taking new patients when he called, then I was going to address that at the time.  Please set up new patient OV.  Thanks.

## 2022-03-12 NOTE — Telephone Encounter (Signed)
Patient was told by his grandma that dr Damita Dunnings needed to speak with him before scheduling with him. However,I spoke with him and he just started a new job that has him only off on Wednesdays,so he will speak with his manager about requesting a day to come in for a new patient appointment. Thanks

## 2022-04-17 ENCOUNTER — Ambulatory Visit: Payer: Self-pay | Admitting: Family Medicine

## 2022-05-07 ENCOUNTER — Ambulatory Visit: Payer: Self-pay | Admitting: Family Medicine

## 2022-05-21 ENCOUNTER — Ambulatory Visit: Payer: Self-pay | Admitting: Family Medicine

## 2022-07-27 ENCOUNTER — Telehealth: Payer: Self-pay | Admitting: Family Medicine

## 2022-07-27 NOTE — Telephone Encounter (Signed)
Agreed, will see at OV.  Thanks.  

## 2022-07-27 NOTE — Telephone Encounter (Signed)
I spoke with pt; pt has periodic bleeding from rectum that usually last a wk; this time rectal bleeding 1 1/2 wks.pt said bright red blood in water of commode and on toilet tissue; pt said he thinks he has hemorrhoids. Pt said has been bleeding on and off for years. No abd pain and no pain when has BM; no diarrhea and no constipation. Pt said no straining but only has rectal bleeding when having a BM.pt said he feels good and normal. Pt in no distress. Now pt said this type bleeding has occurred on and off for years.no fever. Pt had normal BM today and yesterday and pt saw less blood.UC & ED precautions given and pt voiced understanding. Sending note to Dr Fletcher Anon and Para March pool. Pt plans on keeping alreadfy scheduled appt with Dr Para March on 08/03/22 at 8 AM.

## 2022-07-27 NOTE — Telephone Encounter (Signed)
Please call pt.  His grandmother updated me about his situation.  He has OV pending for next week.  I didn't give any information to her.  I was told he was passing blood.  Please triage patient.  Thanks.

## 2022-07-31 NOTE — Telephone Encounter (Signed)
Agree. Thanks

## 2022-07-31 NOTE — Telephone Encounter (Signed)
Pt called back and still bright red blood after normal BM; last BM was this morning and pt said might be little more blood than usual. Pt already has NP appt on 08/03/22 at 8 Am. Pt doesnot have any abd pain, no fever and no pain when has BM. Pt wonders if should be seen before Mon. UC & ED precautions given and pt voiced understanding and pt may go to [facility] ED but pt will keep appt on 08/03/22 also. Sending note to Dr Para March and Para March pool.

## 2022-08-03 ENCOUNTER — Ambulatory Visit: Payer: BC Managed Care – PPO | Admitting: Family Medicine

## 2022-08-03 ENCOUNTER — Encounter: Payer: Self-pay | Admitting: Family Medicine

## 2022-08-03 VITALS — BP 132/82 | HR 63 | Temp 98.2°F | Ht 70.0 in | Wt 155.0 lb

## 2022-08-03 DIAGNOSIS — R911 Solitary pulmonary nodule: Secondary | ICD-10-CM

## 2022-08-03 DIAGNOSIS — R9389 Abnormal findings on diagnostic imaging of other specified body structures: Secondary | ICD-10-CM

## 2022-08-03 DIAGNOSIS — K625 Hemorrhage of anus and rectum: Secondary | ICD-10-CM

## 2022-08-03 DIAGNOSIS — Z7189 Other specified counseling: Secondary | ICD-10-CM | POA: Diagnosis not present

## 2022-08-03 DIAGNOSIS — K649 Unspecified hemorrhoids: Secondary | ICD-10-CM | POA: Diagnosis not present

## 2022-08-03 LAB — CBC WITH DIFFERENTIAL/PLATELET
Basophils Absolute: 0 10*3/uL (ref 0.0–0.1)
Basophils Relative: 0.6 % (ref 0.0–3.0)
Eosinophils Absolute: 0.1 10*3/uL (ref 0.0–0.7)
Eosinophils Relative: 1.3 % (ref 0.0–5.0)
HCT: 41.7 % (ref 39.0–52.0)
Hemoglobin: 13.8 g/dL (ref 13.0–17.0)
Lymphocytes Relative: 42.2 % (ref 12.0–46.0)
Lymphs Abs: 2.1 10*3/uL (ref 0.7–4.0)
MCHC: 33.2 g/dL (ref 30.0–36.0)
MCV: 99.2 fl (ref 78.0–100.0)
Monocytes Absolute: 0.5 10*3/uL (ref 0.1–1.0)
Monocytes Relative: 8.9 % (ref 3.0–12.0)
Neutro Abs: 2.4 10*3/uL (ref 1.4–7.7)
Neutrophils Relative %: 47 % (ref 43.0–77.0)
Platelets: 210 10*3/uL (ref 150.0–400.0)
RBC: 4.2 Mil/uL — ABNORMAL LOW (ref 4.22–5.81)
RDW: 12.3 % (ref 11.5–15.5)
WBC: 5.1 10*3/uL (ref 4.0–10.5)

## 2022-08-03 NOTE — Patient Instructions (Signed)
Go to the lab on the way out.   If you have mychart we'll likely use that to update you.    You should get a call about the CT.   Take care.  Glad to see you.  If you have any more bleeding the use prep H and update me as needed.   You should get a call about seeing GI.

## 2022-08-03 NOTE — Progress Notes (Unsigned)
New patient.    Kevin Cooley designated if patient were incapacitated.    Prev lung imaging d/w pt, due for f/u CT.  D/w pt.  CT ordered.    IMPRESSION: 1. No acute findings. 2. Moderate paraseptal emphysematous change most significant in the right middle lobe where there are bullae measuring up to 5 cm. Adjacent to the superior margin of the bulla there is a slightly focal/nodular opacity measuring up to 1 cm. This may represent atelectasis or scarring, but is indeterminate. Consider one of the following in 3 months for both low-risk and high-risk individuals: (a) repeat chest CT, (b) follow-up PET-CT, or (c) tissue sampling. This recommendation follows the consensus statement:   No resp sx, no hemoptysis.  H/o TG gold positive.    No bleeding other than the blood in his stools. Blood after BM.  BRBPR.  No black tarry stools.  No abd pain.  No FCNAVD.  Still eating well.  No weight loss.  Going on intermittently since high school.  Can go long periods of time w/o sx.  No pain with bleeding.    Meds, vitals, and allergies reviewed.   ROS: Per HPI unless specifically indicated in ROS section   GEN: nad, alert and oriented HEENT: ncat CV: rrr.  no murmur PULM: ctab, no inc wob ABD: soft, +bs EXT: no edema SKIN: no acute rash Nonthrombosed hemorrhoids noted.  No bleeding

## 2022-08-05 ENCOUNTER — Encounter: Payer: Self-pay | Admitting: Family Medicine

## 2022-08-05 DIAGNOSIS — K625 Hemorrhage of anus and rectum: Secondary | ICD-10-CM | POA: Insufficient documentation

## 2022-08-05 DIAGNOSIS — Z7189 Other specified counseling: Secondary | ICD-10-CM | POA: Insufficient documentation

## 2022-08-05 NOTE — Assessment & Plan Note (Signed)
Repeat CT chest ordered.  No respiratory symptoms.  Lungs are clear.

## 2022-08-05 NOTE — Assessment & Plan Note (Signed)
He has not been having pain so I suspect he has internal hemorrhoids that have bulged externally.  Anatomy discussed with patient.  Refer to GI for consideration of banding.  Use Preparation H.  See notes on CBC.  Okay for outpatient follow-up.

## 2022-08-05 NOTE — Assessment & Plan Note (Signed)
Kevin Cooley designated if patient were incapacitated.

## 2022-08-10 ENCOUNTER — Telehealth: Payer: Self-pay

## 2022-08-10 NOTE — Progress Notes (Unsigned)
error 

## 2023-07-29 ENCOUNTER — Telehealth: Payer: Self-pay | Admitting: Family Medicine

## 2023-07-29 NOTE — Telephone Encounter (Signed)
 Patient has expiring order for follow up CT chest.  Is he going to follow up here/get the CT done?    Please let me know.  Thanks.

## 2023-08-02 NOTE — Telephone Encounter (Signed)
 Left voicemail for patient to return call to office.

## 2024-02-11 ENCOUNTER — Ambulatory Visit: Admitting: Family Medicine

## 2024-02-22 ENCOUNTER — Encounter: Payer: Self-pay | Admitting: Family Medicine

## 2024-02-22 ENCOUNTER — Ambulatory Visit: Admitting: Family Medicine

## 2024-02-22 VITALS — BP 130/70 | HR 89 | Temp 98.0°F | Ht 70.0 in | Wt 154.4 lb

## 2024-02-22 DIAGNOSIS — R911 Solitary pulmonary nodule: Secondary | ICD-10-CM

## 2024-02-22 DIAGNOSIS — K625 Hemorrhage of anus and rectum: Secondary | ICD-10-CM

## 2024-02-22 DIAGNOSIS — R9389 Abnormal findings on diagnostic imaging of other specified body structures: Secondary | ICD-10-CM

## 2024-02-22 DIAGNOSIS — Z0000 Encounter for general adult medical examination without abnormal findings: Secondary | ICD-10-CM

## 2024-02-22 DIAGNOSIS — Z7189 Other specified counseling: Secondary | ICD-10-CM

## 2024-02-22 LAB — COMPREHENSIVE METABOLIC PANEL WITH GFR
ALT: 10 U/L (ref 3–53)
AST: 13 U/L (ref 5–37)
Albumin: 4.5 g/dL (ref 3.5–5.2)
Alkaline Phosphatase: 36 U/L — ABNORMAL LOW (ref 39–117)
BUN: 13 mg/dL (ref 6–23)
CO2: 31 meq/L (ref 19–32)
Calcium: 8.9 mg/dL (ref 8.4–10.5)
Chloride: 103 meq/L (ref 96–112)
Creatinine, Ser: 1.19 mg/dL (ref 0.40–1.50)
GFR: 80.85 mL/min
Glucose, Bld: 88 mg/dL (ref 70–99)
Potassium: 4.2 meq/L (ref 3.5–5.1)
Sodium: 138 meq/L (ref 135–145)
Total Bilirubin: 0.7 mg/dL (ref 0.2–1.2)
Total Protein: 7.1 g/dL (ref 6.0–8.3)

## 2024-02-22 LAB — CBC WITH DIFFERENTIAL/PLATELET
Basophils Absolute: 0 K/uL (ref 0.0–0.1)
Basophils Relative: 0.6 % (ref 0.0–3.0)
Eosinophils Absolute: 0.1 K/uL (ref 0.0–0.7)
Eosinophils Relative: 1.3 % (ref 0.0–5.0)
HCT: 39.9 % (ref 39.0–52.0)
Hemoglobin: 13.3 g/dL (ref 13.0–17.0)
Lymphocytes Relative: 40.8 % (ref 12.0–46.0)
Lymphs Abs: 2.4 K/uL (ref 0.7–4.0)
MCHC: 33.4 g/dL (ref 30.0–36.0)
MCV: 93.6 fl (ref 78.0–100.0)
Monocytes Absolute: 0.5 K/uL (ref 0.1–1.0)
Monocytes Relative: 7.8 % (ref 3.0–12.0)
Neutro Abs: 2.9 K/uL (ref 1.4–7.7)
Neutrophils Relative %: 49.5 % (ref 43.0–77.0)
Platelets: 202 K/uL (ref 150.0–400.0)
RBC: 4.26 Mil/uL (ref 4.22–5.81)
RDW: 12.2 % (ref 11.5–15.5)
WBC: 5.8 K/uL (ref 4.0–10.5)

## 2024-02-22 NOTE — Progress Notes (Unsigned)
 CPE- See plan.  Routine anticipatory guidance given to patient.  See health maintenance.  The possibility exists that previously documented standard health maintenance information may have been brought forward from a previous encounter into this note.  If needed, that same information has been updated to reflect the current situation based on today's encounter.    Tdap 2009 Flu d/w pt  PNA and shingles not due.  Covid d/w pt.  Referred to GI 2026. PSA not due Diet and exercise d/w pt.  Annie Follansbee designated if patient were incapacitated.    He has episodic bleeding from hemorrhoid.  No pain with bleeding.  D/w pt about seeing GI.  See notes on labs.   Prev lung imaging d/w pt, due for f/u CT.  D/w pt.  CT ordered.     IMPRESSION: 1. No acute findings. 2. Moderate paraseptal emphysematous change most significant in the right middle lobe where there are bullae measuring up to 5 cm. Adjacent to the superior margin of the bulla there is a slightly focal/nodular opacity measuring up to 1 cm. This may represent atelectasis or scarring, but is indeterminate. Consider one of the following in 3 months for both low-risk and high-risk individuals: (a) repeat chest CT, (b) follow-up PET-CT, or (c) tissue sampling.  He can feel occ cramping in the chest wall, unclear if that is related to the above.  Cramping is episodic and longstanding over the last few years.    H/o TB gold positive. No FCNAVD.  No hemoptysis.    PMH and SH reviewed  Meds, vitals, and allergies reviewed.   ROS: Per HPI.  Unless specifically indicated otherwise in HPI, the patient denies:  General: fever. Eyes: acute vision changes ENT: sore throat Cardiovascular: chest pain Respiratory: SOB GI: vomiting GU: dysuria Musculoskeletal: acute back pain Derm: acute rash Neuro: acute motor dysfunction Psych: worsening mood Endocrine: polydipsia Heme: bleeding Allergy: hayfever  GEN: nad, alert and  oriented HEENT: mucous membranes moist NECK: supple w/o LA CV: rrr. PULM: ctab, no inc wob ABD: soft, +bs EXT: no edema SKIN: well perfused.

## 2024-02-22 NOTE — Patient Instructions (Signed)
 Let me know if you don't get a call about the CT and the GI appointment.  Go to the lab on the way out.   If you have mychart we'll likely use that to update you.    Take care.  Glad to see you.

## 2024-02-23 ENCOUNTER — Ambulatory Visit: Payer: Self-pay | Admitting: Family Medicine

## 2024-02-23 DIAGNOSIS — Z0000 Encounter for general adult medical examination without abnormal findings: Secondary | ICD-10-CM | POA: Insufficient documentation

## 2024-02-23 DIAGNOSIS — Z7189 Other specified counseling: Secondary | ICD-10-CM | POA: Insufficient documentation

## 2024-02-23 NOTE — Assessment & Plan Note (Signed)
Kevin Cooley designated if patient were incapacitated.

## 2024-02-23 NOTE — Assessment & Plan Note (Signed)
 Discussed with patient about previous pulmonary evaluation and previous CT scan.  Repeat CT ordered.  I think it makes sense to get that done before we consider referring him back to pulmonary.

## 2024-02-23 NOTE — Assessment & Plan Note (Signed)
 Refer to GI.  D/w pt.

## 2024-02-23 NOTE — Assessment & Plan Note (Signed)
 Tdap 2009 Flu d/w pt  PNA and shingles not due.  Covid d/w pt.  Referred to GI 2026. PSA not due Diet and exercise d/w pt.  Annie Bair designated if patient were incapacitated.

## 2024-03-12 ENCOUNTER — Ambulatory Visit (HOSPITAL_COMMUNITY): Payer: Self-pay
